# Patient Record
Sex: Male | Born: 1950 | Race: White | Hispanic: No | Marital: Married | Smoking: Never smoker
Health system: Southern US, Community
[De-identification: ages and names within clinical notes are randomized; demographics above are authoritative.]

## PROBLEM LIST (undated history)

## (undated) DIAGNOSIS — F419 Anxiety disorder, unspecified: Secondary | ICD-10-CM

---

## 2012-05-28 ENCOUNTER — Other Ambulatory Visit: Payer: Self-pay | Admitting: *Deleted

## 2013-03-18 ENCOUNTER — Emergency Department (HOSPITAL_COMMUNITY)
Admission: EM | Admit: 2013-03-18 | Discharge: 2013-03-18 | Disposition: A | Discharge status: Home / Self Care | Payer: BC Managed Care – PPO | Attending: Emergency Medicine | Admitting: Emergency Medicine

## 2013-03-18 ENCOUNTER — Encounter (HOSPITAL_COMMUNITY): Payer: Self-pay | Admitting: Emergency Medicine

## 2013-03-18 ENCOUNTER — Emergency Department (HOSPITAL_COMMUNITY): Payer: BC Managed Care – PPO

## 2013-03-18 DIAGNOSIS — I493 Ventricular premature depolarization: Secondary | ICD-10-CM

## 2013-03-18 DIAGNOSIS — R002 Palpitations: Secondary | ICD-10-CM

## 2013-03-18 DIAGNOSIS — I4949 Other premature depolarization: Secondary | ICD-10-CM | POA: Insufficient documentation

## 2013-03-18 DIAGNOSIS — F411 Generalized anxiety disorder: Secondary | ICD-10-CM | POA: Insufficient documentation

## 2013-03-18 DIAGNOSIS — Z791 Long term (current) use of non-steroidal anti-inflammatories (NSAID): Secondary | ICD-10-CM | POA: Insufficient documentation

## 2013-03-18 HISTORY — DX: Anxiety disorder, unspecified: F41.9

## 2013-03-18 LAB — BASIC METABOLIC PANEL
BUN: 16 mg/dL (ref 6–23)
CO2: 29 mEq/L (ref 19–32)
Calcium: 10 mg/dL (ref 8.4–10.5)
Chloride: 103 mEq/L (ref 96–112)
Creatinine, Ser: 0.86 mg/dL (ref 0.50–1.35)
GFR calc Af Amer: >90 mL/min (ref >90)
GFR, EST NON AFRICAN AMERICAN: 90 mL/min — AB (ref >90)
GLUCOSE: 119 mg/dL — AB (ref 70–99)
POTASSIUM: 4 meq/L (ref 3.7–5.3)
SODIUM: 144 meq/L (ref 137–147)

## 2013-03-18 LAB — POCT I-STAT TROPONIN I: Troponin i, poc: 0 ng/mL (ref 0.00–0.08)

## 2013-03-18 LAB — CBC
HEMATOCRIT: 47.4 % (ref 39.0–52.0)
HEMOGLOBIN: 16.4 g/dL (ref 13.0–17.0)
MCH: 32.8 pg (ref 26.0–34.0)
MCHC: 34.6 g/dL (ref 30.0–36.0)
MCV: 94.8 fL (ref 78.0–100.0)
Platelets: 260 10*3/uL (ref 150–400)
RBC: 5 MIL/uL (ref 4.22–5.81)
RDW: 13.1 % (ref 11.5–15.5)
WBC: 6.3 10*3/uL (ref 4.0–10.5)

## 2013-03-18 LAB — D-DIMER, QUANTITATIVE (NOT AT ARMC): D-Dimer, Quant: 0.27 ug/mL-FEU (ref 0.00–0.48)

## 2013-03-18 NOTE — ED Notes (Signed)
Called lab to confirm if blue top was sent. Was told they do not have a blue top tube for D-dimer.

## 2013-03-18 NOTE — ED Provider Notes (Signed)
CSN: [id number]     Arrival date & time 03/18/13  1031 History   First MD Initiated Contact with Patient 03/18/13 1039     Chief Complaint  Patient presents with  . Palpitations    Patient is a 63 y.o. male presenting with palpitations. The history is provided by the patient.  Palpitations Palpitations quality:  Irregular (Felt like a skipped beat intermittinetly but frequently) Onset quality:  Sudden Duration:  1 second Timing:  Intermittent Chronicity:  New Context: not anxiety, not exercise and not illicit drugs   Relieved by:  Nothing Worsened by:  Nothing tried Associated symptoms: no chest pain, no chest pressure and no syncope   Pt went to a walk in clinic.  They had difficulty obtaining his EKG.  They noted an irregularity and sent him to the ED for furher evaluation.  No prior history of heart problems.    Past Medical History  Diagnosis Date  . Anxiety    History reviewed. No pertinent past surgical history. No family history on file. History  Substance Use Topics  . Smoking status: Never Smoker   . Smokeless tobacco: Not on file  . Alcohol Use: No    Review of Systems  Cardiovascular: Positive for palpitations. Negative for chest pain and syncope.  Psychiatric/Behavioral: The patient is nervous/anxious.   All other systems reviewed and are negative.      Allergies  Review of patient's allergies indicates no known allergies.  Home Medications   Current Outpatient Rx  Name  Route  Sig  Dispense  Refill  . naproxen sodium (ANAPROX) 220 MG tablet   Oral   Take 220 mg by mouth 2 (two) times daily with a meal.          BP 155/93  Pulse 105  Temp(Src) 98.2 F (36.8 C) (Oral)  Resp 12  Ht 6' (1.829 m)  Wt 175 lb (79.379 kg)  BMI 23.73 kg/m2  SpO2 97% Physical Exam  Nursing note and vitals reviewed. Constitutional: He appears well-developed and well-nourished. No distress.  HENT:  Head: Normocephalic and atraumatic.  Right Ear: External ear  normal.  Left Ear: External ear normal.  Eyes: Conjunctivae are normal. Right eye exhibits no discharge. Left eye exhibits no discharge. No scleral icterus.  Neck: Neck supple. No tracheal deviation present.  Cardiovascular: Normal rate, regular rhythm and intact distal pulses.   Pulmonary/Chest: Effort normal and breath sounds normal. No stridor. No respiratory distress. He has no wheezes. He has no rales.  Abdominal: Soft. Bowel sounds are normal. He exhibits no distension. There is no tenderness. There is no rebound and no guarding.  Musculoskeletal: He exhibits no edema and no tenderness.  Neurological: He is alert. He has normal strength. No cranial nerve deficit (no facial droop, extraocular movements intact, no slurred speech) or sensory deficit. He exhibits normal muscle tone. He displays no seizure activity. Coordination normal.  Skin: Skin is warm and dry. No rash noted.  Psychiatric: He has a normal mood and affect.    ED Course  Procedures  Labs Review Labs Reviewed  BASIC METABOLIC PANEL - Abnormal; Notable for the following:    Glucose, Bld 119 (*)    GFR calc non Af Amer 90 (*)    All other components within normal limits  CBC  D-DIMER, QUANTITATIVE  POCT I-STAT TROPONIN I   Imaging Review Dg Chest 2 View  03/18/2013   CLINICAL DATA:  Cardiac palpitations  EXAM: CHEST  2 VIEW  COMPARISON:  None.  FINDINGS: The lungs are clear. The heart size and pulmonary vascularity are normal. No adenopathy. No pneumothorax. No bone lesions.  IMPRESSION: No abnormality noted.   Electronically Signed   By: Lowella Grip M.D.   On: 03/18/2013 11:19    EKG Interpretation    Date/Time:  Wednesday March 18 2013 10:40:52 EST Ventricular Rate:  102 PR Interval:  186 QRS Duration: 95 QT Interval:  347 QTC Calculation: 452 R Axis:   66 Text Interpretation:  Sinus tachycardia Minimal ST depression, diffuse leads No previous tracing Confirmed by Natoria Archibald  MD-J, Shamaria Kavan ([id number]) on 03/18/2013  10:47:09 AM            MDM   Final diagnoses:  Palpitation  PVC (premature ventricular contraction)   Pt's EKG from the outpatient office was reviewed.  Appears to have had a lot of aritifact in that EKG.  The EKGs from EMS and here in the ED were normal with the exception of an occasional PVC.  Doubt cardiac ischemia or PE.  Will dc home, recommend follow up with pcp or cardiologist.    Kathalene Frames, MD 03/18/13 [phone]

## 2013-03-18 NOTE — ED Notes (Signed)
Patient transported to X-ray 

## 2013-03-18 NOTE — Discharge Instructions (Signed)
Premature Ventricular Contraction Premature ventricular contraction (PVC) is an irregularity of the heart rhythm involving extra or skipped heartbeats. In some cases, they may occur without obvious cause or heart disease. Other times, they can be caused by an electrolyte change in the blood. These need to be corrected. They can also be seen when there is not enough oxygen going to the heart. A common cause of this is plaque or cholesterol buildup. This buildup decreases the blood supply to the heart. In addition, extra beats may be caused or aggravated by:  Excessive smoking.  Alcohol consumption.  Caffeine.  Certain medications  Some street drugs. SYMPTOMS   The sensation of feeling your heart skipping a beat (palpitations).  In many cases, the person may have no symptoms. SIGNS AND TESTS   A physical examination may show an occasional irregularity, but if the PVC beats do not happen often, they may not be found on physical exam.  Blood pressure is usually normal.  Other tests that may find extra beats of the heart are:  An EKG (electrocardiogram)  A Holter monitor which can monitor your heart over longer periods of time  An Angiogram (study of the heart arteries). TREATMENT  Usually extra heartbeats do not need treatment. The condition is treated only if symptoms are severe or if extra beats are very frequent or are causing problems. An underlying cause, if discovered, may also require treatment.  Treatment may also be needed if there may be a risk for other more serious cardiac arrhythmias.  PREVENTION   Moderation in caffeine, alcohol, and tobacco use may reduce the risk of ectopic heartbeats in some people.  Exercise often helps people who lead a sedentary (inactive) lifestyle. PROGNOSIS  PVC heartbeats are generally harmless and do not need treatment.  RISKS AND COMPLICATIONS   Ventricular tachycardia (occasionally).  There usually are no complications.  Other  arrhythmias (occasionally). SEEK IMMEDIATE MEDICAL CARE IF:   You feel palpitations that are frequent or continual.  You develop chest pain or other problems such as shortness of breath, sweating, or nausea and vomiting.  You become light-headed or faint (pass out).  You get worse or do not improve with treatment. Document Released: 09/02/2003 Document Revised: 04/09/2011 Document Reviewed: 03/14/2007 [employer] Patient Information 2014 [employer].  

## 2013-03-18 NOTE — ED Notes (Signed)
Per EMS-Pt coming from [facility] c/o increasing heart palpations since this morning, also left shoulder and flank pain. Denies any at current. While at [facility] had questionable EKG with artifact and was sent for evaluation for elevation. EMS ekg SR after shaving his chest hair. Was given 324 aspirin. BP 170/90, HR 99. Denies pain at current. Is a x 4. Skin warm and dry.

## 2014-03-12 DIAGNOSIS — D1801 Hemangioma of skin and subcutaneous tissue: Secondary | ICD-10-CM | POA: Diagnosis not present

## 2014-03-12 DIAGNOSIS — L821 Other seborrheic keratosis: Secondary | ICD-10-CM | POA: Diagnosis not present

## 2014-03-12 DIAGNOSIS — L820 Inflamed seborrheic keratosis: Secondary | ICD-10-CM | POA: Diagnosis not present

## 2014-03-12 DIAGNOSIS — L738 Other specified follicular disorders: Secondary | ICD-10-CM | POA: Diagnosis not present

## 2014-06-15 ENCOUNTER — Other Ambulatory Visit: Payer: Self-pay | Admitting: Family Medicine

## 2014-06-15 DIAGNOSIS — Z125 Encounter for screening for malignant neoplasm of prostate: Secondary | ICD-10-CM | POA: Diagnosis not present

## 2014-06-15 DIAGNOSIS — Z23 Encounter for immunization: Secondary | ICD-10-CM | POA: Diagnosis not present

## 2014-06-15 DIAGNOSIS — Z136 Encounter for screening for cardiovascular disorders: Secondary | ICD-10-CM

## 2014-06-15 DIAGNOSIS — I10 Essential (primary) hypertension: Secondary | ICD-10-CM | POA: Diagnosis not present

## 2014-06-15 DIAGNOSIS — Z0000 Encounter for general adult medical examination without abnormal findings: Secondary | ICD-10-CM | POA: Diagnosis not present

## 2014-12-29 DIAGNOSIS — Z23 Encounter for immunization: Secondary | ICD-10-CM | POA: Diagnosis not present

## 2014-12-29 DIAGNOSIS — I10 Essential (primary) hypertension: Secondary | ICD-10-CM | POA: Diagnosis not present

## 2015-03-03 DIAGNOSIS — H2513 Age-related nuclear cataract, bilateral: Secondary | ICD-10-CM | POA: Diagnosis not present

## 2015-03-03 DIAGNOSIS — Z0100 Encounter for examination of eyes and vision without abnormal findings: Secondary | ICD-10-CM | POA: Diagnosis not present

## 2015-09-22 DIAGNOSIS — Z0000 Encounter for general adult medical examination without abnormal findings: Secondary | ICD-10-CM | POA: Diagnosis not present

## 2015-09-22 DIAGNOSIS — J309 Allergic rhinitis, unspecified: Secondary | ICD-10-CM | POA: Diagnosis not present

## 2015-09-22 DIAGNOSIS — E785 Hyperlipidemia, unspecified: Secondary | ICD-10-CM | POA: Diagnosis not present

## 2015-09-22 DIAGNOSIS — Z23 Encounter for immunization: Secondary | ICD-10-CM | POA: Diagnosis not present

## 2015-09-22 DIAGNOSIS — I10 Essential (primary) hypertension: Secondary | ICD-10-CM | POA: Diagnosis not present

## 2015-09-22 DIAGNOSIS — Z125 Encounter for screening for malignant neoplasm of prostate: Secondary | ICD-10-CM | POA: Diagnosis not present

## 2016-08-15 DIAGNOSIS — I10 Essential (primary) hypertension: Secondary | ICD-10-CM | POA: Diagnosis not present

## 2016-08-15 DIAGNOSIS — R42 Dizziness and giddiness: Secondary | ICD-10-CM | POA: Diagnosis not present

## 2016-08-15 DIAGNOSIS — Z125 Encounter for screening for malignant neoplasm of prostate: Secondary | ICD-10-CM | POA: Diagnosis not present

## 2016-10-10 DIAGNOSIS — I10 Essential (primary) hypertension: Secondary | ICD-10-CM | POA: Diagnosis not present

## 2016-10-10 DIAGNOSIS — Z125 Encounter for screening for malignant neoplasm of prostate: Secondary | ICD-10-CM | POA: Diagnosis not present

## 2016-10-10 DIAGNOSIS — Z0000 Encounter for general adult medical examination without abnormal findings: Secondary | ICD-10-CM | POA: Diagnosis not present

## 2016-10-10 DIAGNOSIS — Z23 Encounter for immunization: Secondary | ICD-10-CM | POA: Diagnosis not present

## 2016-10-10 DIAGNOSIS — E7800 Pure hypercholesterolemia, unspecified: Secondary | ICD-10-CM | POA: Diagnosis not present

## 2017-07-26 DIAGNOSIS — H524 Presbyopia: Secondary | ICD-10-CM | POA: Diagnosis not present

## 2017-07-26 DIAGNOSIS — H5203 Hypermetropia, bilateral: Secondary | ICD-10-CM | POA: Diagnosis not present

## 2017-07-26 DIAGNOSIS — H2513 Age-related nuclear cataract, bilateral: Secondary | ICD-10-CM | POA: Diagnosis not present

## 2017-12-09 DIAGNOSIS — Z1211 Encounter for screening for malignant neoplasm of colon: Secondary | ICD-10-CM | POA: Diagnosis not present

## 2017-12-09 DIAGNOSIS — Z125 Encounter for screening for malignant neoplasm of prostate: Secondary | ICD-10-CM | POA: Diagnosis not present

## 2017-12-09 DIAGNOSIS — Z1159 Encounter for screening for other viral diseases: Secondary | ICD-10-CM | POA: Diagnosis not present

## 2017-12-09 DIAGNOSIS — Z23 Encounter for immunization: Secondary | ICD-10-CM | POA: Diagnosis not present

## 2017-12-09 DIAGNOSIS — I10 Essential (primary) hypertension: Secondary | ICD-10-CM | POA: Diagnosis not present

## 2017-12-09 DIAGNOSIS — E7800 Pure hypercholesterolemia, unspecified: Secondary | ICD-10-CM | POA: Diagnosis not present

## 2017-12-09 DIAGNOSIS — Z0000 Encounter for general adult medical examination without abnormal findings: Secondary | ICD-10-CM | POA: Diagnosis not present

## 2017-12-09 DIAGNOSIS — H9319 Tinnitus, unspecified ear: Secondary | ICD-10-CM | POA: Diagnosis not present

## 2017-12-26 DIAGNOSIS — Z1211 Encounter for screening for malignant neoplasm of colon: Secondary | ICD-10-CM | POA: Diagnosis not present

## 2018-02-03 DIAGNOSIS — E7800 Pure hypercholesterolemia, unspecified: Secondary | ICD-10-CM | POA: Diagnosis not present

## 2018-02-03 DIAGNOSIS — I10 Essential (primary) hypertension: Secondary | ICD-10-CM | POA: Diagnosis not present

## 2018-02-03 DIAGNOSIS — J309 Allergic rhinitis, unspecified: Secondary | ICD-10-CM | POA: Diagnosis not present

## 2018-08-11 DIAGNOSIS — I10 Essential (primary) hypertension: Secondary | ICD-10-CM | POA: Diagnosis not present

## 2018-08-11 DIAGNOSIS — H5203 Hypermetropia, bilateral: Secondary | ICD-10-CM | POA: Diagnosis not present

## 2018-08-11 DIAGNOSIS — H2513 Age-related nuclear cataract, bilateral: Secondary | ICD-10-CM | POA: Diagnosis not present

## 2018-08-11 DIAGNOSIS — H43813 Vitreous degeneration, bilateral: Secondary | ICD-10-CM | POA: Diagnosis not present

## 2018-08-11 DIAGNOSIS — H524 Presbyopia: Secondary | ICD-10-CM | POA: Diagnosis not present

## 2018-08-11 DIAGNOSIS — E7800 Pure hypercholesterolemia, unspecified: Secondary | ICD-10-CM | POA: Diagnosis not present

## 2018-12-24 DIAGNOSIS — I10 Essential (primary) hypertension: Secondary | ICD-10-CM | POA: Diagnosis not present

## 2018-12-24 DIAGNOSIS — K5909 Other constipation: Secondary | ICD-10-CM | POA: Diagnosis not present

## 2018-12-24 DIAGNOSIS — Z0000 Encounter for general adult medical examination without abnormal findings: Secondary | ICD-10-CM | POA: Diagnosis not present

## 2018-12-24 DIAGNOSIS — M79646 Pain in unspecified finger(s): Secondary | ICD-10-CM | POA: Diagnosis not present

## 2018-12-24 DIAGNOSIS — E7800 Pure hypercholesterolemia, unspecified: Secondary | ICD-10-CM | POA: Diagnosis not present

## 2019-01-03 DIAGNOSIS — Z23 Encounter for immunization: Secondary | ICD-10-CM | POA: Diagnosis not present

## 2019-04-20 DIAGNOSIS — I10 Essential (primary) hypertension: Secondary | ICD-10-CM | POA: Diagnosis not present

## 2019-04-20 DIAGNOSIS — E7800 Pure hypercholesterolemia, unspecified: Secondary | ICD-10-CM | POA: Diagnosis not present

## 2019-06-11 DIAGNOSIS — E7800 Pure hypercholesterolemia, unspecified: Secondary | ICD-10-CM | POA: Diagnosis not present

## 2019-06-11 DIAGNOSIS — I10 Essential (primary) hypertension: Secondary | ICD-10-CM | POA: Diagnosis not present

## 2019-07-09 DIAGNOSIS — H6982 Other specified disorders of Eustachian tube, left ear: Secondary | ICD-10-CM | POA: Diagnosis not present

## 2019-08-05 DIAGNOSIS — I10 Essential (primary) hypertension: Secondary | ICD-10-CM | POA: Diagnosis not present

## 2019-08-05 DIAGNOSIS — H9319 Tinnitus, unspecified ear: Secondary | ICD-10-CM | POA: Diagnosis not present

## 2019-08-05 DIAGNOSIS — E7800 Pure hypercholesterolemia, unspecified: Secondary | ICD-10-CM | POA: Diagnosis not present

## 2019-08-27 DIAGNOSIS — R42 Dizziness and giddiness: Secondary | ICD-10-CM | POA: Diagnosis not present

## 2019-09-01 DIAGNOSIS — H9042 Sensorineural hearing loss, unilateral, left ear, with unrestricted hearing on the contralateral side: Secondary | ICD-10-CM | POA: Diagnosis not present

## 2019-09-01 DIAGNOSIS — H9312 Tinnitus, left ear: Secondary | ICD-10-CM | POA: Diagnosis not present

## 2019-09-01 DIAGNOSIS — R42 Dizziness and giddiness: Secondary | ICD-10-CM | POA: Diagnosis not present

## 2019-09-03 ENCOUNTER — Other Ambulatory Visit: Payer: Self-pay | Admitting: Otolaryngology

## 2019-09-03 DIAGNOSIS — H918X9 Other specified hearing loss, unspecified ear: Secondary | ICD-10-CM

## 2019-09-04 DIAGNOSIS — R42 Dizziness and giddiness: Secondary | ICD-10-CM | POA: Diagnosis not present

## 2019-09-11 DIAGNOSIS — H9312 Tinnitus, left ear: Secondary | ICD-10-CM | POA: Diagnosis not present

## 2019-09-11 DIAGNOSIS — H9042 Sensorineural hearing loss, unilateral, left ear, with unrestricted hearing on the contralateral side: Secondary | ICD-10-CM | POA: Diagnosis not present

## 2019-09-11 DIAGNOSIS — R42 Dizziness and giddiness: Secondary | ICD-10-CM | POA: Diagnosis not present

## 2019-09-11 DIAGNOSIS — H8102 Meniere's disease, left ear: Secondary | ICD-10-CM | POA: Diagnosis not present

## 2019-09-24 ENCOUNTER — Other Ambulatory Visit: Payer: Self-pay

## 2019-09-24 ENCOUNTER — Ambulatory Visit
Admission: RE | Admit: 2019-09-24 | Discharge: 2019-09-24 | Disposition: A | Discharge status: Home / Self Care | Payer: Medicare HMO | Source: Ambulatory Visit | Attending: Otolaryngology | Admitting: Otolaryngology

## 2019-09-24 DIAGNOSIS — R42 Dizziness and giddiness: Secondary | ICD-10-CM | POA: Diagnosis not present

## 2019-09-24 DIAGNOSIS — H918X9 Other specified hearing loss, unspecified ear: Secondary | ICD-10-CM

## 2019-09-24 MED ORDER — GADOBENATE DIMEGLUMINE 529 MG/ML IV SOLN
15.0000 mL | Freq: Once | INTRAVENOUS | Status: AC | PRN
  Administered 2019-09-24: 15 mL via INTRAVENOUS

## 2019-09-28 DIAGNOSIS — H2513 Age-related nuclear cataract, bilateral: Secondary | ICD-10-CM | POA: Diagnosis not present

## 2019-09-28 DIAGNOSIS — H5203 Hypermetropia, bilateral: Secondary | ICD-10-CM | POA: Diagnosis not present

## 2019-10-26 DIAGNOSIS — E7800 Pure hypercholesterolemia, unspecified: Secondary | ICD-10-CM | POA: Diagnosis not present

## 2019-10-26 DIAGNOSIS — I10 Essential (primary) hypertension: Secondary | ICD-10-CM | POA: Diagnosis not present

## 2019-12-08 DIAGNOSIS — I10 Essential (primary) hypertension: Secondary | ICD-10-CM | POA: Diagnosis not present

## 2019-12-08 DIAGNOSIS — E7800 Pure hypercholesterolemia, unspecified: Secondary | ICD-10-CM | POA: Diagnosis not present

## 2019-12-15 DIAGNOSIS — H8102 Meniere's disease, left ear: Secondary | ICD-10-CM | POA: Diagnosis not present

## 2019-12-15 DIAGNOSIS — R42 Dizziness and giddiness: Secondary | ICD-10-CM | POA: Diagnosis not present

## 2019-12-15 DIAGNOSIS — H9042 Sensorineural hearing loss, unilateral, left ear, with unrestricted hearing on the contralateral side: Secondary | ICD-10-CM | POA: Diagnosis not present

## 2019-12-18 DIAGNOSIS — Z1211 Encounter for screening for malignant neoplasm of colon: Secondary | ICD-10-CM | POA: Diagnosis not present

## 2020-02-08 DIAGNOSIS — H9319 Tinnitus, unspecified ear: Secondary | ICD-10-CM | POA: Diagnosis not present

## 2020-02-08 DIAGNOSIS — Z23 Encounter for immunization: Secondary | ICD-10-CM | POA: Diagnosis not present

## 2020-02-08 DIAGNOSIS — Z125 Encounter for screening for malignant neoplasm of prostate: Secondary | ICD-10-CM | POA: Diagnosis not present

## 2020-02-08 DIAGNOSIS — E7800 Pure hypercholesterolemia, unspecified: Secondary | ICD-10-CM | POA: Diagnosis not present

## 2020-02-08 DIAGNOSIS — I10 Essential (primary) hypertension: Secondary | ICD-10-CM | POA: Diagnosis not present

## 2020-02-24 DIAGNOSIS — I10 Essential (primary) hypertension: Secondary | ICD-10-CM | POA: Diagnosis not present

## 2020-02-24 DIAGNOSIS — E785 Hyperlipidemia, unspecified: Secondary | ICD-10-CM | POA: Diagnosis not present

## 2020-02-24 DIAGNOSIS — E7800 Pure hypercholesterolemia, unspecified: Secondary | ICD-10-CM | POA: Diagnosis not present

## 2020-02-25 DIAGNOSIS — S3992XA Unspecified injury of lower back, initial encounter: Secondary | ICD-10-CM | POA: Diagnosis not present

## 2020-04-11 DIAGNOSIS — E785 Hyperlipidemia, unspecified: Secondary | ICD-10-CM | POA: Diagnosis not present

## 2020-04-11 DIAGNOSIS — I10 Essential (primary) hypertension: Secondary | ICD-10-CM | POA: Diagnosis not present

## 2020-09-19 DIAGNOSIS — L600 Ingrowing nail: Secondary | ICD-10-CM | POA: Diagnosis not present

## 2020-09-19 DIAGNOSIS — E7800 Pure hypercholesterolemia, unspecified: Secondary | ICD-10-CM | POA: Diagnosis not present

## 2020-09-19 DIAGNOSIS — M180 Bilateral primary osteoarthritis of first carpometacarpal joints: Secondary | ICD-10-CM | POA: Diagnosis not present

## 2020-09-19 DIAGNOSIS — E785 Hyperlipidemia, unspecified: Secondary | ICD-10-CM | POA: Diagnosis not present

## 2020-09-19 DIAGNOSIS — I10 Essential (primary) hypertension: Secondary | ICD-10-CM | POA: Diagnosis not present

## 2020-09-19 DIAGNOSIS — Z0000 Encounter for general adult medical examination without abnormal findings: Secondary | ICD-10-CM | POA: Diagnosis not present

## 2021-02-08 DIAGNOSIS — H2513 Age-related nuclear cataract, bilateral: Secondary | ICD-10-CM | POA: Diagnosis not present

## 2021-02-08 DIAGNOSIS — H524 Presbyopia: Secondary | ICD-10-CM | POA: Diagnosis not present

## 2021-02-08 DIAGNOSIS — H5203 Hypermetropia, bilateral: Secondary | ICD-10-CM | POA: Diagnosis not present

## 2021-02-21 DIAGNOSIS — D224 Melanocytic nevi of scalp and neck: Secondary | ICD-10-CM | POA: Diagnosis not present

## 2021-02-21 DIAGNOSIS — L738 Other specified follicular disorders: Secondary | ICD-10-CM | POA: Diagnosis not present

## 2021-02-21 DIAGNOSIS — L570 Actinic keratosis: Secondary | ICD-10-CM | POA: Diagnosis not present

## 2021-02-21 DIAGNOSIS — D485 Neoplasm of uncertain behavior of skin: Secondary | ICD-10-CM | POA: Diagnosis not present

## 2021-02-21 DIAGNOSIS — L821 Other seborrheic keratosis: Secondary | ICD-10-CM | POA: Diagnosis not present

## 2021-10-25 IMAGING — MR MR HEAD WO/W CM
11 of 12 series · 43 of 48 positions shown · IV contrast (multihance)
Comparison: None.

CLINICAL DATA: Vertigo and balance disturbance over the last 6
weeks. Tinnitus on the left.

EXAM:
MRI HEAD WITHOUT AND WITH CONTRAST
TECHNIQUE: Multiplanar, multiecho pulse sequences of the brain and surrounding
structures were obtained without and with intravenous contrast.
CONTRAST:  15mL MULTIHANCE GADOBENATE DIMEGLUMINE 529 MG/ML IV SOLN

[Series 5: T1 · sagittal · 4.0mm · 0.75mm/px · 3 of 29 slices shown (1 of 4)]
[im 1/29]
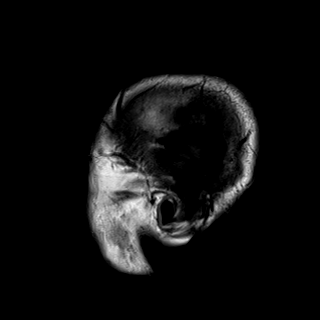
[im 15/29]
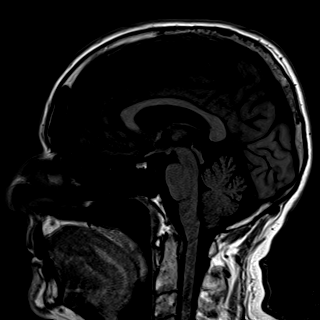
[im 29/29]
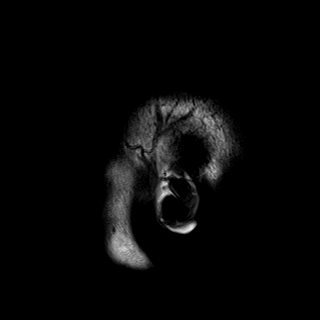

[Series 6: DWI · axial · 3.0mm · 1.50mm/px · z∈[-42,+99]mm · 6 of 78 slices shown (1 of 2)]
[im 1/78]
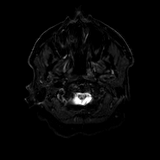
[im 16/78]
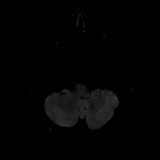
[im 31/78]
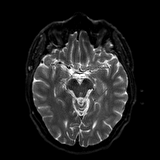
[im 47/78]
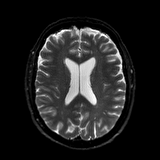
[im 62/78]
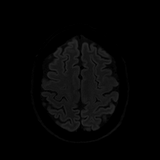
[im 78/78]
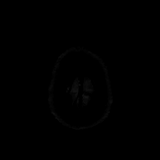

[Series 7: DWI · axial · 3.0mm · 1.50mm/px · z∈[-42,+99]mm · 3 of 39 slices shown (2 of 2)]
[im 1/39]
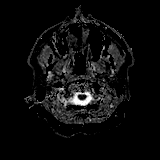
[im 20/39]
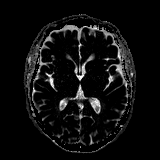
[im 39/39]
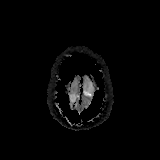

[Series 8: T2 · axial · 4.0mm · 0.38mm/px · z∈[-53,+94]mm · 3 of 33 slices shown]
[im 1/33]
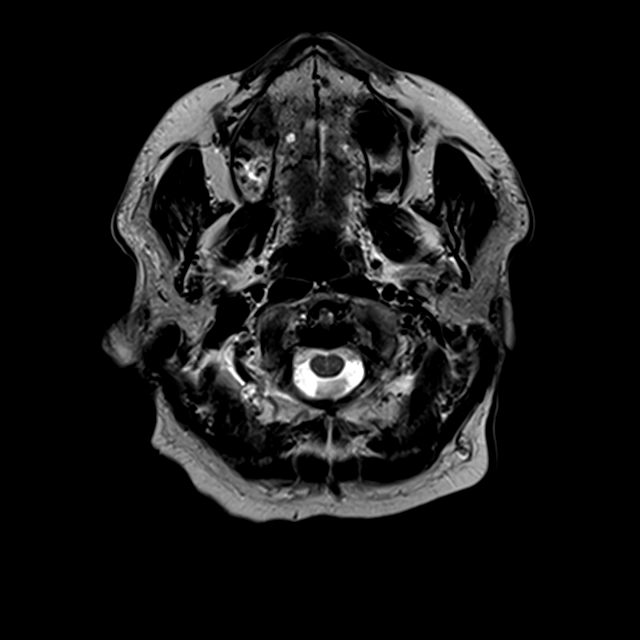
[im 17/33]
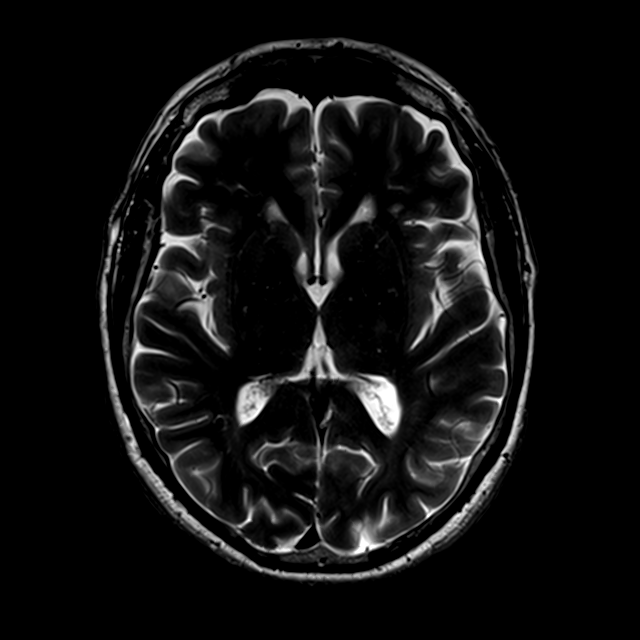
[im 33/33]
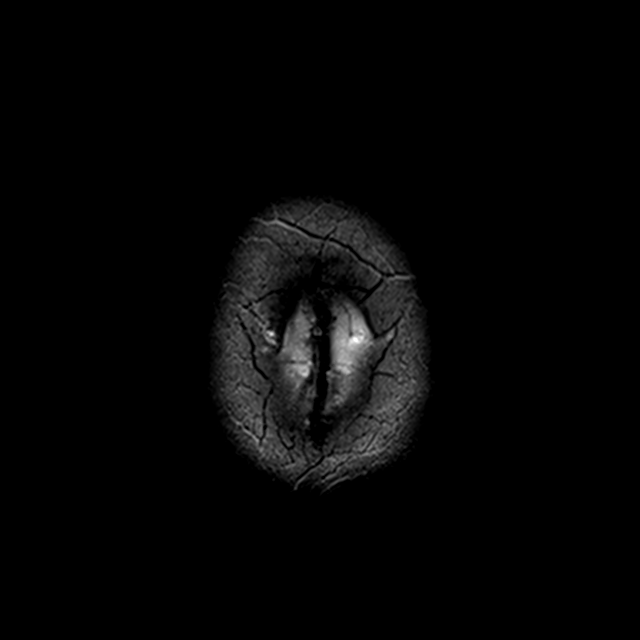

[Series 9: FLAIR · axial · 4.0mm · 0.75mm/px · z∈[-54,+93]mm · 3 of 33 slices shown]
[im 1/33]
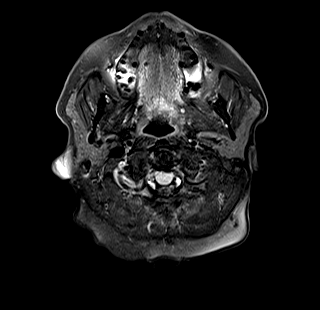
[im 17/33]
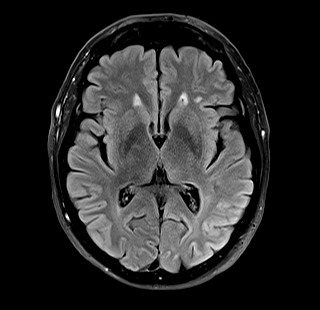
[im 33/33]
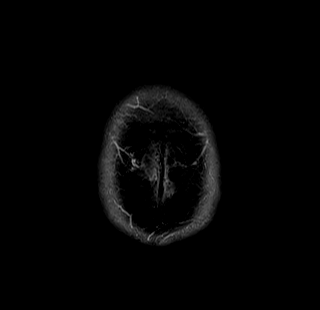

[Series 11: swi_images · axial · 1.5mm · 0.90mm/px · z∈[-50,+87]mm · 8 of 96 slices shown]
[im 1/96]
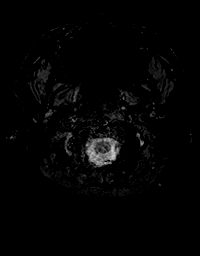
[im 14/96]
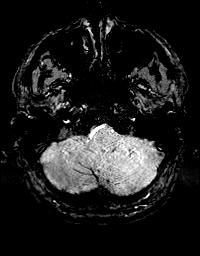
[im 28/96]
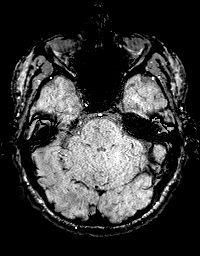
[im 41/96]
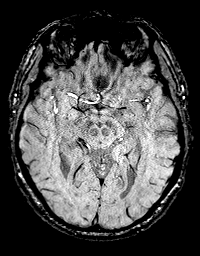
[im 55/96]
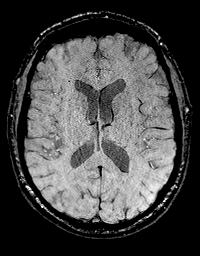
[im 68/96]
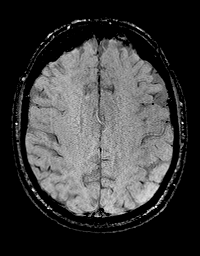
[im 82/96]
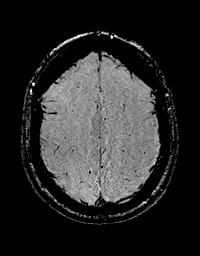
[im 96/96]
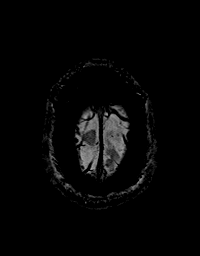

[Series 12: T1 · coronal · 3.0mm · 0.47mm/px · 1 of 13 slices shown (2 of 4)]
[im 1/13]
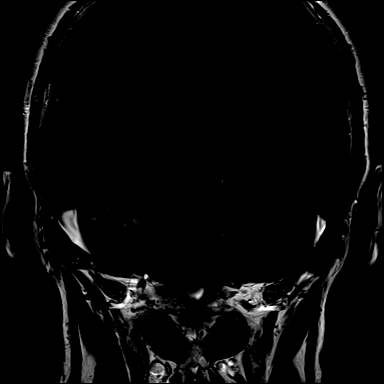

[Series 13: T1 · axial · 3.0mm · 0.47mm/px · 1 of 15 slices shown (3 of 4)]
[im 1/15]
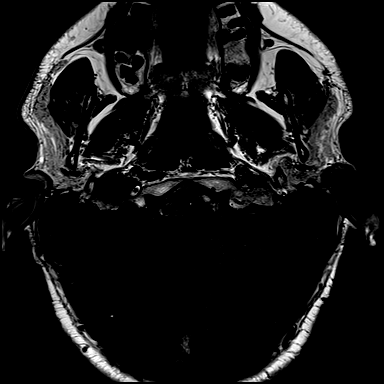

[Series 15: T1 post-contrast · coronal · 3.0mm · 0.47mm/px · 1 of 13 slices shown (1 of 2)]
[im 1/13]
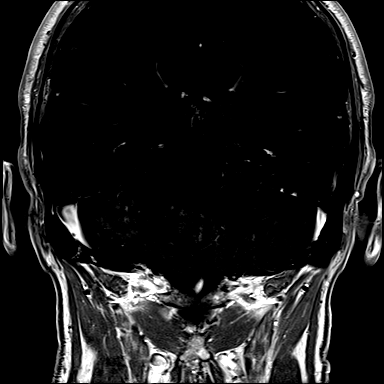

[Series 16: T1 post-contrast · axial · 3.0mm · 0.47mm/px · 1 of 15 slices shown (2 of 2)]
[im 1/15]
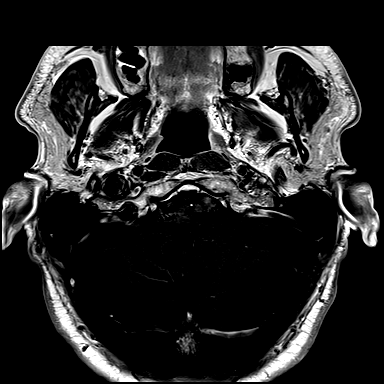

[Series 17: T1 · axial · 1.0mm · 0.75mm/px · z∈[-58,+95]mm · 13 of 160 slices shown (4 of 4)]
[im 1/160]
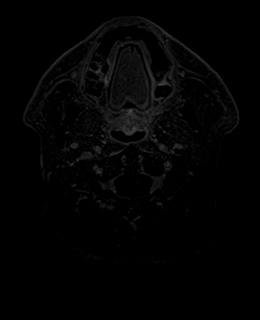
[im 14/160]
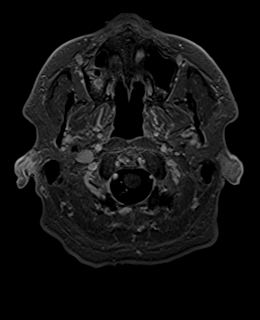
[im 27/160]
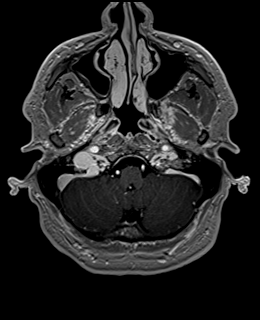
[im 40/160]
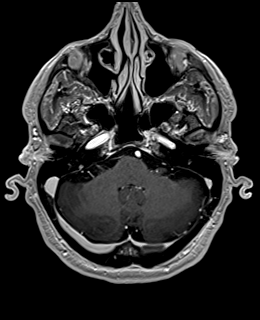
[im 54/160]
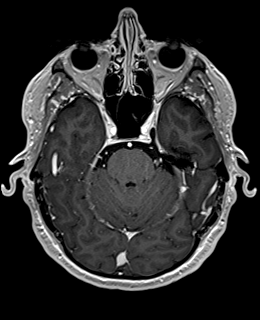
[im 67/160]
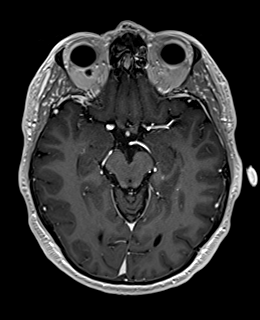
[im 80/160]
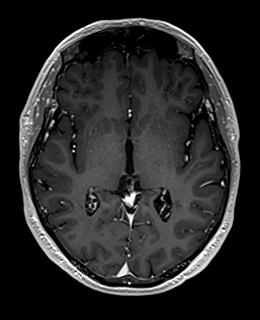
[im 93/160]
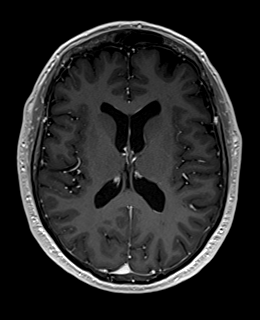
[im 107/160]
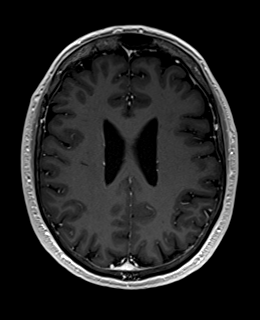
[im 120/160]
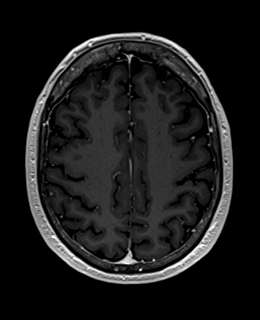
[im 133/160]
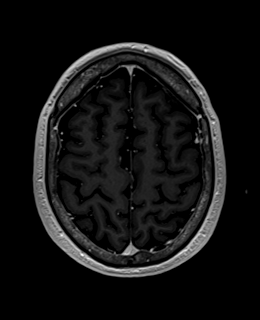
[im 146/160]
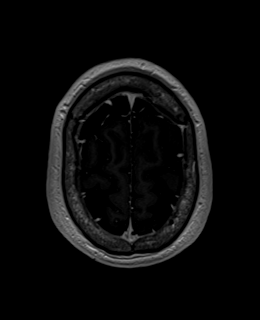
[im 160/160]
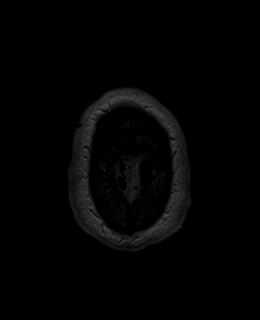

[43 of 48 positions shown; findings below may reference images not displayed]

FINDINGS: Brain: The brain has a normal appearance without evidence of
malformation, atrophy, old or acute small or large vessel
infarction, mass lesion, hemorrhage, hydrocephalus or extra-axial
collection. CP angle regions appear normal. After contrast
administration, no abnormal enhancement occurs.

Vascular: Major vessels at the base of the brain show flow. Venous
sinuses appear patent.

Skull and upper cervical spine: Normal.

Sinuses/Orbits: Clear/normal.

Other: No fluid in the middle ears or mastoids.
IMPRESSION: Normal examination. No abnormality seen to explain the presenting
symptoms.

## 2021-10-27 DIAGNOSIS — Z0000 Encounter for general adult medical examination without abnormal findings: Secondary | ICD-10-CM | POA: Diagnosis not present

## 2021-10-27 DIAGNOSIS — E7800 Pure hypercholesterolemia, unspecified: Secondary | ICD-10-CM | POA: Diagnosis not present

## 2021-10-27 DIAGNOSIS — I10 Essential (primary) hypertension: Secondary | ICD-10-CM | POA: Diagnosis not present

## 2021-10-27 DIAGNOSIS — H8109 Meniere's disease, unspecified ear: Secondary | ICD-10-CM | POA: Diagnosis not present

## 2021-10-27 DIAGNOSIS — E785 Hyperlipidemia, unspecified: Secondary | ICD-10-CM | POA: Diagnosis not present

## 2022-03-19 DIAGNOSIS — L308 Other specified dermatitis: Secondary | ICD-10-CM | POA: Diagnosis not present

## 2022-03-19 DIAGNOSIS — D224 Melanocytic nevi of scalp and neck: Secondary | ICD-10-CM | POA: Diagnosis not present

## 2022-05-09 DIAGNOSIS — K5900 Constipation, unspecified: Secondary | ICD-10-CM | POA: Diagnosis not present

## 2022-05-09 DIAGNOSIS — Z6826 Body mass index (BMI) 26.0-26.9, adult: Secondary | ICD-10-CM | POA: Diagnosis not present

## 2022-05-09 DIAGNOSIS — I10 Essential (primary) hypertension: Secondary | ICD-10-CM | POA: Diagnosis not present

## 2022-05-09 DIAGNOSIS — E7800 Pure hypercholesterolemia, unspecified: Secondary | ICD-10-CM | POA: Diagnosis not present

## 2022-07-19 DIAGNOSIS — H2513 Age-related nuclear cataract, bilateral: Secondary | ICD-10-CM | POA: Diagnosis not present

## 2022-07-19 DIAGNOSIS — H5203 Hypermetropia, bilateral: Secondary | ICD-10-CM | POA: Diagnosis not present

## 2022-11-27 DIAGNOSIS — Z125 Encounter for screening for malignant neoplasm of prostate: Secondary | ICD-10-CM | POA: Diagnosis not present

## 2022-11-27 DIAGNOSIS — E7800 Pure hypercholesterolemia, unspecified: Secondary | ICD-10-CM | POA: Diagnosis not present

## 2022-11-27 DIAGNOSIS — I10 Essential (primary) hypertension: Secondary | ICD-10-CM | POA: Diagnosis not present

## 2022-12-11 DIAGNOSIS — Z0000 Encounter for general adult medical examination without abnormal findings: Secondary | ICD-10-CM | POA: Diagnosis not present

## 2022-12-11 DIAGNOSIS — I10 Essential (primary) hypertension: Secondary | ICD-10-CM | POA: Diagnosis not present

## 2022-12-11 DIAGNOSIS — M7521 Bicipital tendinitis, right shoulder: Secondary | ICD-10-CM | POA: Diagnosis not present

## 2022-12-11 DIAGNOSIS — Z125 Encounter for screening for malignant neoplasm of prostate: Secondary | ICD-10-CM | POA: Diagnosis not present

## 2022-12-11 DIAGNOSIS — Z23 Encounter for immunization: Secondary | ICD-10-CM | POA: Diagnosis not present

## 2022-12-11 DIAGNOSIS — E7800 Pure hypercholesterolemia, unspecified: Secondary | ICD-10-CM | POA: Diagnosis not present

## 2022-12-11 DIAGNOSIS — G5603 Carpal tunnel syndrome, bilateral upper limbs: Secondary | ICD-10-CM | POA: Diagnosis not present

## 2023-06-20 DIAGNOSIS — R69 Illness, unspecified: Secondary | ICD-10-CM | POA: Diagnosis not present

## 2023-07-10 DIAGNOSIS — I10 Essential (primary) hypertension: Secondary | ICD-10-CM | POA: Diagnosis not present

## 2023-07-10 DIAGNOSIS — E7800 Pure hypercholesterolemia, unspecified: Secondary | ICD-10-CM | POA: Diagnosis not present

## 2023-07-10 DIAGNOSIS — Z1211 Encounter for screening for malignant neoplasm of colon: Secondary | ICD-10-CM | POA: Diagnosis not present

## 2023-07-24 DIAGNOSIS — H2513 Age-related nuclear cataract, bilateral: Secondary | ICD-10-CM | POA: Diagnosis not present

## 2023-07-24 DIAGNOSIS — H5203 Hypermetropia, bilateral: Secondary | ICD-10-CM | POA: Diagnosis not present

## 2023-08-07 DIAGNOSIS — Z1212 Encounter for screening for malignant neoplasm of rectum: Secondary | ICD-10-CM | POA: Diagnosis not present

## 2023-08-07 DIAGNOSIS — Z1211 Encounter for screening for malignant neoplasm of colon: Secondary | ICD-10-CM | POA: Diagnosis not present

## 2023-12-13 DIAGNOSIS — E7800 Pure hypercholesterolemia, unspecified: Secondary | ICD-10-CM | POA: Diagnosis not present

## 2023-12-13 DIAGNOSIS — K5909 Other constipation: Secondary | ICD-10-CM | POA: Diagnosis not present

## 2023-12-13 DIAGNOSIS — H9313 Tinnitus, bilateral: Secondary | ICD-10-CM | POA: Diagnosis not present

## 2023-12-13 DIAGNOSIS — Z0000 Encounter for general adult medical examination without abnormal findings: Secondary | ICD-10-CM | POA: Diagnosis not present

## 2023-12-13 DIAGNOSIS — I10 Essential (primary) hypertension: Secondary | ICD-10-CM | POA: Diagnosis not present

## 2023-12-13 DIAGNOSIS — Z23 Encounter for immunization: Secondary | ICD-10-CM | POA: Diagnosis not present

## 2023-12-13 DIAGNOSIS — Z125 Encounter for screening for malignant neoplasm of prostate: Secondary | ICD-10-CM | POA: Diagnosis not present
